# Patient Record
Sex: Male | Born: 1975 | Race: White | Hispanic: No | Smoking: Never smoker
Health system: Southern US, Community
[De-identification: ages and names within clinical notes are randomized; demographics above are authoritative.]

## PROBLEM LIST (undated history)

## (undated) DIAGNOSIS — C801 Malignant (primary) neoplasm, unspecified: Secondary | ICD-10-CM

## (undated) DIAGNOSIS — N289 Disorder of kidney and ureter, unspecified: Secondary | ICD-10-CM

## (undated) DIAGNOSIS — I10 Essential (primary) hypertension: Secondary | ICD-10-CM

## (undated) DIAGNOSIS — J40 Bronchitis, not specified as acute or chronic: Secondary | ICD-10-CM

## (undated) HISTORY — PX: TONSILLECTOMY: SUR1361

## (undated) HISTORY — PX: CYSTOSCOPY/RETROGRADE/URETEROSCOPY/STONE EXTRACTION WITH BASKET: SHX5317

---

## 2015-06-18 DIAGNOSIS — J40 Bronchitis, not specified as acute or chronic: Secondary | ICD-10-CM

## 2015-06-18 HISTORY — DX: Bronchitis, not specified as acute or chronic: J40

## 2016-02-19 ENCOUNTER — Observation Stay (HOSPITAL_COMMUNITY): Admission: EM | Admit: 2016-02-19 | Discharge: 2016-02-20 | Attending: Internal Medicine | Admitting: Internal Medicine

## 2016-02-19 ENCOUNTER — Encounter (HOSPITAL_COMMUNITY): Payer: Self-pay | Admitting: Emergency Medicine

## 2016-02-19 ENCOUNTER — Emergency Department (HOSPITAL_COMMUNITY)

## 2016-02-19 DIAGNOSIS — I10 Essential (primary) hypertension: Secondary | ICD-10-CM | POA: Diagnosis not present

## 2016-02-19 DIAGNOSIS — N200 Calculus of kidney: Secondary | ICD-10-CM | POA: Diagnosis not present

## 2016-02-19 DIAGNOSIS — R109 Unspecified abdominal pain: Secondary | ICD-10-CM | POA: Diagnosis present

## 2016-02-19 DIAGNOSIS — N201 Calculus of ureter: Secondary | ICD-10-CM

## 2016-02-19 DIAGNOSIS — N132 Hydronephrosis with renal and ureteral calculous obstruction: Principal | ICD-10-CM | POA: Insufficient documentation

## 2016-02-19 HISTORY — DX: Disorder of kidney and ureter, unspecified: N28.9

## 2016-02-19 HISTORY — DX: Malignant (primary) neoplasm, unspecified: C80.1

## 2016-02-19 LAB — URINALYSIS, ROUTINE W REFLEX MICROSCOPIC
BILIRUBIN URINE: NEGATIVE
GLUCOSE, UA: NEGATIVE mg/dL
KETONES UR: NEGATIVE mg/dL
LEUKOCYTES UA: NEGATIVE
Nitrite: NEGATIVE
PH: 5.5 (ref 5.0–8.0)
PROTEIN: NEGATIVE mg/dL
Specific Gravity, Urine: <1.005 — ABNORMAL LOW (ref 1.005–1.030)

## 2016-02-19 LAB — URINE MICROSCOPIC-ADD ON

## 2016-02-19 LAB — BASIC METABOLIC PANEL
ANION GAP: 9 (ref 5–15)
BUN: 14 mg/dL (ref 6–20)
CALCIUM: 9.5 mg/dL (ref 8.9–10.3)
CO2: 27 mmol/L (ref 22–32)
CREATININE: 1.19 mg/dL (ref 0.61–1.24)
Chloride: 104 mmol/L (ref 101–111)
GFR calc Af Amer: >60 mL/min (ref >60)
GLUCOSE: 88 mg/dL (ref 65–99)
Potassium: 3.8 mmol/L (ref 3.5–5.1)
Sodium: 140 mmol/L (ref 135–145)

## 2016-02-19 LAB — CBC WITH DIFFERENTIAL/PLATELET
BASOS ABS: 0 10*3/uL (ref 0.0–0.1)
Basophils Relative: 0 %
EOS PCT: 1 %
Eosinophils Absolute: 0.1 10*3/uL (ref 0.0–0.7)
HEMATOCRIT: 48.6 % (ref 39.0–52.0)
Hemoglobin: 16.6 g/dL (ref 13.0–17.0)
LYMPHS PCT: 25 %
Lymphs Abs: 1.9 10*3/uL (ref 0.7–4.0)
MCH: 30.1 pg (ref 26.0–34.0)
MCHC: 34.2 g/dL (ref 30.0–36.0)
MCV: 88 fL (ref 78.0–100.0)
MONO ABS: 0.5 10*3/uL (ref 0.1–1.0)
MONOS PCT: 6 %
NEUTROS ABS: 5.1 10*3/uL (ref 1.7–7.7)
Neutrophils Relative %: 68 %
PLATELETS: 173 10*3/uL (ref 150–400)
RBC: 5.52 MIL/uL (ref 4.22–5.81)
RDW: 12.2 % (ref 11.5–15.5)
WBC: 7.5 10*3/uL (ref 4.0–10.5)

## 2016-02-19 MED ORDER — SODIUM CHLORIDE 0.9 % IV BOLUS (SEPSIS)
1000.0000 mL | Freq: Once | INTRAVENOUS | Status: AC
  Administered 2016-02-19: 1000 mL via INTRAVENOUS

## 2016-02-19 MED ORDER — POLYETHYLENE GLYCOL 3350 17 G PO PACK: 17.0000 g | PACK | Freq: Every day | ORAL | Status: DC | PRN

## 2016-02-19 MED ORDER — HYDROMORPHONE HCL 1 MG/ML IJ SOLN
1.0000 mg | INTRAMUSCULAR | Status: AC | PRN
  Filled 2016-02-19: qty 1

## 2016-02-19 MED ORDER — HYDROMORPHONE HCL 1 MG/ML IJ SOLN
1.0000 mg | Freq: Once | INTRAMUSCULAR | Status: AC
  Administered 2016-02-19: 1 mg via INTRAVENOUS
  Filled 2016-02-19: qty 1

## 2016-02-19 MED ORDER — ONDANSETRON HCL 4 MG PO TABS: 4.0000 mg | ORAL_TABLET | Freq: Four times a day (QID) | ORAL | Status: DC | PRN

## 2016-02-19 MED ORDER — ONDANSETRON HCL 4 MG/2ML IJ SOLN
4.0000 mg | INTRAMUSCULAR | Status: DC | PRN
  Administered 2016-02-19: 4 mg via INTRAVENOUS

## 2016-02-19 MED ORDER — TAMSULOSIN HCL 0.4 MG PO CAPS
0.4000 mg | ORAL_CAPSULE | Freq: Two times a day (BID) | ORAL | Status: DC
  Administered 2016-02-19 – 2016-02-20 (×2): 0.4 mg via ORAL
  Filled 2016-02-19 (×2): qty 1

## 2016-02-19 MED ORDER — KETOROLAC TROMETHAMINE 30 MG/ML IJ SOLN
30.0000 mg | Freq: Once | INTRAMUSCULAR | Status: AC
  Administered 2016-02-19: 30 mg via INTRAVENOUS
  Filled 2016-02-19: qty 1

## 2016-02-19 MED ORDER — ONDANSETRON HCL 4 MG/2ML IJ SOLN: 4.0000 mg | Freq: Four times a day (QID) | INTRAMUSCULAR | Status: DC | PRN

## 2016-02-19 MED ORDER — TRAZODONE HCL 50 MG PO TABS
50.0000 mg | ORAL_TABLET | Freq: Every evening | ORAL | Status: DC | PRN
  Administered 2016-02-20: 50 mg via ORAL
  Filled 2016-02-19: qty 1

## 2016-02-19 MED ORDER — SODIUM CHLORIDE 0.9 % IV SOLN
INTRAVENOUS | Status: DC
  Administered 2016-02-20 (×2): via INTRAVENOUS

## 2016-02-19 MED ORDER — HYDROMORPHONE HCL 1 MG/ML IJ SOLN
1.0000 mg | INTRAMUSCULAR | Status: DC | PRN
  Administered 2016-02-20 (×2): 1 mg via INTRAVENOUS
  Filled 2016-02-19: qty 1

## 2016-02-19 MED ORDER — ONDANSETRON HCL 4 MG/2ML IJ SOLN
4.0000 mg | Freq: Three times a day (TID) | INTRAMUSCULAR | Status: DC | PRN
  Filled 2016-02-19: qty 2

## 2016-02-19 MED ORDER — ACETAMINOPHEN 650 MG RE SUPP: 650.0000 mg | Freq: Four times a day (QID) | RECTAL | Status: DC | PRN

## 2016-02-19 MED ORDER — ACETAMINOPHEN 325 MG PO TABS: 650.0000 mg | ORAL_TABLET | Freq: Four times a day (QID) | ORAL | Status: DC | PRN

## 2016-02-19 MED ORDER — SENNA 8.6 MG PO TABS
1.0000 | ORAL_TABLET | Freq: Two times a day (BID) | ORAL | Status: DC
  Administered 2016-02-20 (×2): 8.6 mg via ORAL
  Filled 2016-02-19 (×2): qty 1

## 2016-02-19 MED ORDER — ALBUTEROL SULFATE (2.5 MG/3ML) 0.083% IN NEBU: 2.5000 mg | INHALATION_SOLUTION | RESPIRATORY_TRACT | Status: DC | PRN

## 2016-02-19 MED ORDER — KETOROLAC TROMETHAMINE 30 MG/ML IJ SOLN
30.0000 mg | Freq: Four times a day (QID) | INTRAMUSCULAR | Status: DC
  Administered 2016-02-19 – 2016-02-20 (×4): 30 mg via INTRAVENOUS
  Filled 2016-02-19 (×4): qty 1

## 2016-02-19 MED ORDER — SODIUM CHLORIDE 0.9% FLUSH: 3.0000 mL | INTRAVENOUS | Status: DC | PRN

## 2016-02-19 MED ORDER — SODIUM CHLORIDE 0.9 % IV SOLN: 250.0000 mL | INTRAVENOUS | Status: DC | PRN

## 2016-02-19 MED ORDER — AMLODIPINE BESYLATE 5 MG PO TABS
5.0000 mg | ORAL_TABLET | Freq: Every day | ORAL | Status: DC
  Administered 2016-02-20: 5 mg via ORAL
  Filled 2016-02-19: qty 1

## 2016-02-19 MED ORDER — OXYCODONE-ACETAMINOPHEN 5-325 MG PO TABS
2.0000 | ORAL_TABLET | Freq: Once | ORAL | Status: AC
  Administered 2016-02-19: 2 via ORAL
  Filled 2016-02-19: qty 2

## 2016-02-19 MED ORDER — ONDANSETRON HCL 4 MG/2ML IJ SOLN
4.0000 mg | Freq: Once | INTRAMUSCULAR | Status: AC
  Administered 2016-02-19: 4 mg via INTRAVENOUS
  Filled 2016-02-19: qty 2

## 2016-02-19 MED ORDER — HYDROXYZINE HCL 50 MG/ML IM SOLN
25.0000 mg | Freq: Four times a day (QID) | INTRAMUSCULAR | Status: DC
  Administered 2016-02-20: 25 mg via INTRAMUSCULAR
  Filled 2016-02-19 (×5): qty 0.5

## 2016-02-19 MED ORDER — SODIUM CHLORIDE 0.9% FLUSH
3.0000 mL | Freq: Two times a day (BID) | INTRAVENOUS | Status: DC
  Administered 2016-02-20: 3 mL via INTRAVENOUS

## 2016-02-19 MED ORDER — HYDRALAZINE HCL 20 MG/ML IJ SOLN: 10.0000 mg | Freq: Four times a day (QID) | INTRAMUSCULAR | Status: DC | PRN

## 2016-02-19 MED ORDER — SODIUM CHLORIDE 0.9 % IV SOLN: INTRAVENOUS | Status: DC

## 2016-02-19 NOTE — H&P (Signed)
Patient Demographics:    Austin Hanson, is a 40 y.o. male  MRN: [id number]   DOB - 08/07/1975  Admit Date - 02/19/2016  Outpatient Primary MD for the patient is No PCP Per Patient   Assessment & Plan:    Principal Problem:   Nephrolithiasis Active Problems:   Kidney stone   Left nephrolithiasis  CT Abd Stone Protocol:- Mild to moderate left hydronephrosis due to a 0.8 x 0.5 x 1.0 cm upper to mid left ureteral stone. Three small nonobstructing stones right kidney.  1)Lt Sided nephrolithiasis with mild to moderate   Hydronephrosis-please see CT report above, give Flomax, scheduled IV Toradol, IV Dilaudid when necessary, Zofran and hydroxyzine as ordered. Dr. Junious Silk the Urologist to see in consultation, given that this stone is about 1 cm and higher up in the upper to mid left ureter, doubt patient would pass the stone spontaneously.  Patient had " basket" retrieval of kidney stone about 8 years ago. No fevers, no chills, no leukocytosis at this time.   2)HTN-stable, restart amlodipine,  may use IV Hydralazine 10 mg  Every 4 hours Prn for systolic blood pressure over 160 mmhg  3)Disposition- transfer from [facility] to [facility] at request of Dr. Junious Silk, the Urologist due to concerns about availability of  anesthesia services at [facility]. Post discharge patient will return back to  correctional facility  4)FEN- nothing by mouth for now, hydrate IV, monitor urine output and watch for possible passage of stones.   With History of - Reviewed by me  Past Medical History:  Diagnosis Date  . Renal disorder    kidney stones      Past Surgical History:  Procedure Laterality Date  . CYSTOSCOPY/RETROGRADE/URETEROSCOPY/STONE EXTRACTION WITH BASKET        Chief Complaint    Patient presents with  . Flank Pain      HPI:    Austin Hanson  is a 40 y.o. male, With history of hypertension on amlodipine and h/o Recurrent kidney stones since age 32 who presents with left flank pain associated with nausea but no vomiting. Left Flank pain started this morning, patient endorses some urinary frequency, No dysuria, no gross hematuria, No testicular symptoms, his current symptoms remind him of his previous kidney stone attacks.   no fever no chills. No diarrhea. Correctional officer at bedside at time of my evaluation.   In the ED CT abdomen with stone protocol reveals 0.81 cm left upper to mid ureteral stone with mild-to-moderate hydronephrosis.     Review of systems:    In addition to the HPI above,   A full 12 point Review of 10 Systems was done, except as stated above, all other Review of 10 Systems were negative.    Social History:  Reviewed by me    Social History  Substance Use Topics  . Smoking status: Never Smoker  . Smokeless tobacco: Never Used  .  Alcohol use No       Family History :  Reviewed by me   History reviewed. No pertinent family history.    Home Medications:   Prior to Admission medications   Medication Sig Start Date End Date Taking? Authorizing Provider  AmLODIPine Besylate (NORVASC PO) Take 5 tablets by mouth daily.    Yes Historical Provider, MD     Allergies:     Allergies  Allergen Reactions  . Morphine And Related Hives     Physical Exam:   Vitals  Blood pressure 139/87, pulse (!) 57, temperature 97.8 F (36.6 C), temperature source Oral, resp. rate 18, height 5\' 10"  (1.778 m), weight 90.7 kg (200 lb), SpO2 98 %.  Physical Examination: General appearance - alert, well appearing, and in no distress  Mental status - alert, oriented to person, place, and time,  Eyes - sclera anicteric Neck - supple, no JVD elevation , Chest - clear  to auscultation bilaterally, symmetrical air movement,  Heart - S1 and  S2 normal,  Abdomen - soft, left lower tenderness with positive CVA area tenderness, nondistended, no masses or organomegaly Neurological - screening mental status exam normal, neck supple without rigidity, cranial nerves II through XII intact, DTR's normal and symmetric Extremities - no pedal edema noted, intact peripheral pulses Skin - warm, dry    Data Review:    CBC  Recent Labs Lab 02/19/16 1635  WBC 7.5  HGB 16.6  HCT 48.6  PLT 173  MCV 88.0  MCH 30.1  MCHC 34.2  RDW 12.2  LYMPHSABS 1.9  MONOABS 0.5  EOSABS 0.1  BASOSABS 0.0   ------------------------------------------------------------------------------------------------------------------  Chemistries   Recent Labs Lab 02/19/16 1635  NA 140  K 3.8  CL 104  CO2 27  GLUCOSE 88  BUN 14  CREATININE 1.19  CALCIUM 9.5   ------------------------------------------------------------------------------------------------------------------ estimated creatinine clearance is 91.6 mL/min (by C-G formula based on SCr of 1.19 mg/dL). ------------------------------------------------------------------------------------------------------------------ No results for input(s): TSH, T4TOTAL, T3FREE, THYROIDAB in the last 72 hours.  Invalid input(s): FREET3   Coagulation profile No results for input(s): INR, PROTIME in the last 168 hours. ------------------------------------------------------------------------------------------------------------------- No results for input(s): DDIMER in the last 72 hours. -------------------------------------------------------------------------------------------------------------------  Cardiac Enzymes No results for input(s): CKMB, TROPONINI, MYOGLOBIN in the last 168 hours.  Invalid input(s): CK ------------------------------------------------------------------------------------------------------------------ No results found for:  BNP   ---------------------------------------------------------------------------------------------------------------  Urinalysis    Component Value Date/Time   COLORURINE YELLOW 02/19/2016 [place] 02/19/2016 1635   LABSPEC <1.005 (L) 02/19/2016 1635   PHURINE 5.5 02/19/2016 1635   GLUCOSEU NEGATIVE 02/19/2016 1635   HGBUR MODERATE (A) 02/19/2016 1635   BILIRUBINUR NEGATIVE 02/19/2016 1635   KETONESUR NEGATIVE 02/19/2016 1635   PROTEINUR NEGATIVE 02/19/2016 1635   NITRITE NEGATIVE 02/19/2016 1635   LEUKOCYTESUR NEGATIVE 02/19/2016 1635    ----------------------------------------------------------------------------------------------------------------   Imaging Results:    Ct Renal Stone Study  Result Date: 02/19/2016 CLINICAL DATA:  Acute onset left flank pain at 8 a.m. this morning. History of kidney stones. EXAM: CT ABDOMEN AND PELVIS WITHOUT CONTRAST TECHNIQUE: Multidetector CT imaging of the abdomen and pelvis was performed following the standard protocol without IV contrast. COMPARISON:  None. FINDINGS: Mild dependent atelectasis is seen in the left lung base. Lung bases are otherwise clear. No pleural or pericardial effusion. Heart size is normal. There is mild left hydronephrosis with stranding about the left kidney and ureter due to a mid to upper left ureteral stone at the level of the L3  measuring 0.8 cm AP by 0.5 cm transverse by 1 cm craniocaudal. No other urinary tract stones are seen on the left. The patient has 3 small nonobstructing right renal stones. No hydronephrosis on the right or ureteral stone. No urinary bladder stones. Seminal vesicles and prostate gland are unremarkable. The gallbladder, liver, spleen, adrenal glands and pancreas appear normal. Small fat containing umbilical hernia is noted. The stomach and small and large bowel appear normal. No evidence of appendicitis is identified. No lymphadenopathy or fluid. No lytic or sclerotic bony lesion.  IMPRESSION: Mild to moderate left hydronephrosis due to a 0.8 x 0.5 x 1.0 cm upper to mid left ureteral stone. Three small nonobstructing stones right kidney. Small fat containing umbilical hernia. Electronically Signed   By: Inge Rise M.D.   On: 02/19/2016 17:39    Radiological Exams on Admission: Ct Renal Stone Study  Result Date: 02/19/2016 CLINICAL DATA:  Acute onset left flank pain at 8 a.m. this morning. History of kidney stones. EXAM: CT ABDOMEN AND PELVIS WITHOUT CONTRAST TECHNIQUE: Multidetector CT imaging of the abdomen and pelvis was performed following the standard protocol without IV contrast. COMPARISON:  None. FINDINGS: Mild dependent atelectasis is seen in the left lung base. Lung bases are otherwise clear. No pleural or pericardial effusion. Heart size is normal. There is mild left hydronephrosis with stranding about the left kidney and ureter due to a mid to upper left ureteral stone at the level of the L3 measuring 0.8 cm AP by 0.5 cm transverse by 1 cm craniocaudal. No other urinary tract stones are seen on the left. The patient has 3 small nonobstructing right renal stones. No hydronephrosis on the right or ureteral stone. No urinary bladder stones. Seminal vesicles and prostate gland are unremarkable. The gallbladder, liver, spleen, adrenal glands and pancreas appear normal. Small fat containing umbilical hernia is noted. The stomach and small and large bowel appear normal. No evidence of appendicitis is identified. No lymphadenopathy or fluid. No lytic or sclerotic bony lesion. IMPRESSION: Mild to moderate left hydronephrosis due to a 0.8 x 0.5 x 1.0 cm upper to mid left ureteral stone. Three small nonobstructing stones right kidney. Small fat containing umbilical hernia. Electronically Signed   By: Inge Rise M.D.   On: 02/19/2016 17:39    DVT Prophylaxis -SCD  AM Labs Ordered, also please review Full Orders  Family Communication: Admission, patients condition and plan  of care including tests being ordered have been discussed with the patient who indicate understanding and agree with the plan   Code Status - Full Code  Likely DC to  correctional facility  Condition   stable  Austin Hanson M.D on 02/19/2016 at 9:35 PM   Between 7am to 7pm - Pager - [phone]  After 7pm go to [email or web address] - password [email or web address]  [facility] - Office  [phone]  Dragon dictation system was used to create this note, attempts have been made to correct errors, however presence of uncorrected errors is not a reflection quality of care provided.

## 2016-02-19 NOTE — ED Provider Notes (Signed)
[place] DEPT Provider Note   CSN: [id number] Arrival date & time: 02/19/16  1611     History   Chief Complaint Chief Complaint  Patient presents with  . Flank Pain    HPI Austin Hanson is a 40 y.o. male.  Patient presents with left-sided flank pain onset this morning upon waking. It comes and goes and feels similar to previous kidney stones. It radiates to his left lower abdomen. Endorses urinary frequency and urgency but denies hematuria. Reports previous surgery for kidney stone several years ago. Denies fever. Has nausea but no vomiting. Denies chest pain or shortness of breath. Denies any testicular pain. Is not taking anything for the pain. He is incarcerated in prison. No change in bowel habits.   The history is provided by the patient.  Flank Pain  Associated symptoms include abdominal pain. Pertinent negatives include no chest pain, no headaches and no shortness of breath.    Past Medical History:  Diagnosis Date  . Renal disorder    kidney stones    There are no active problems to display for this patient.   Past Surgical History:  Procedure Laterality Date  . CYSTOSCOPY/RETROGRADE/URETEROSCOPY/STONE EXTRACTION WITH BASKET         Home Medications    Prior to Admission medications   Medication Sig Start Date End Date Taking? Authorizing Provider  AmLODIPine Besylate (NORVASC PO) Take 5 tablets by mouth daily.    Yes Historical Provider, MD    Family History History reviewed. No pertinent family history.  Social History Social History  Substance Use Topics  . Smoking status: Never Smoker  . Smokeless tobacco: Never Used  . Alcohol use No     Allergies   Morphine and related   Review of Systems Review of Systems  Constitutional: Negative for activity change, appetite change, fatigue and fever.  HENT: Negative for congestion and rhinorrhea.   Eyes: Negative for visual disturbance.  Respiratory: Negative for cough, chest tightness  and shortness of breath.   Cardiovascular: Negative for chest pain.  Gastrointestinal: Positive for abdominal pain and nausea. Negative for vomiting.  Genitourinary: Positive for dysuria and flank pain. Negative for hematuria, scrotal swelling and testicular pain.  Musculoskeletal: Positive for back pain. Negative for arthralgias and myalgias.  Skin: Negative for wound.  Neurological: Negative for dizziness, weakness and headaches.   A complete 10 system review of systems was obtained and all systems are negative except as noted in the HPI and PMH.    Physical Exam Updated Vital Signs BP (!) 141/104 (BP Location: Left Arm)   Pulse (!) 127   Temp 97.8 F (36.6 C) (Oral)   Resp 16   Ht 5\' 10"  (1.778 m)   Wt 200 lb (90.7 kg)   SpO2 100%   BMI 28.70 kg/m   Physical Exam  Constitutional: He is oriented to person, place, and time. He appears well-developed and well-nourished. No distress.  HENT:  Head: Normocephalic and atraumatic.  Mouth/Throat: Oropharynx is clear and moist. No oropharyngeal exudate.  Eyes: Conjunctivae and EOM are normal. Pupils are equal, round, and reactive to light.  Neck: Normal range of motion. Neck supple.  No meningismus.  Cardiovascular: Normal rate, regular rhythm, normal heart sounds and intact distal pulses.   No murmur heard. Pulmonary/Chest: Effort normal and breath sounds normal. No respiratory distress.  Abdominal: Soft. There is tenderness. There is no rebound and no guarding.  L sided abdominal pain, no guarding or rebound  Genitourinary:  Genitourinary Comments: No testicular  tenderness  Musculoskeletal: Normal range of motion. He exhibits tenderness. He exhibits no edema.  L CVAT  Neurological: He is alert and oriented to person, place, and time. No cranial nerve deficit. He exhibits normal muscle tone. Coordination normal.  No ataxia on finger to nose bilaterally. No pronator drift. 5/5 strength throughout. CN 2-12 intact.Equal grip  strength. Sensation intact.   Skin: Skin is warm.  Psychiatric: He has a normal mood and affect. His behavior is normal.  Nursing note and vitals reviewed.    ED Treatments / Results  Labs (all labs ordered are listed, but only abnormal results are displayed) Labs Reviewed  URINALYSIS, ROUTINE W REFLEX MICROSCOPIC (NOT AT [facility]) - Abnormal; Notable for the following:       Result Value   Specific Gravity, Urine <1.005 (*)    Hgb urine dipstick MODERATE (*)    All other components within normal limits  URINE MICROSCOPIC-ADD ON - Abnormal; Notable for the following:    Squamous Epithelial / LPF 0-5 (*)    Bacteria, UA RARE (*)    All other components within normal limits  CBC WITH DIFFERENTIAL/PLATELET  BASIC METABOLIC PANEL    EKG  EKG Interpretation None       Radiology Ct Renal Stone Study  Result Date: 02/19/2016 CLINICAL DATA:  Acute onset left flank pain at 8 a.m. this morning. History of kidney stones. EXAM: CT ABDOMEN AND PELVIS WITHOUT CONTRAST TECHNIQUE: Multidetector CT imaging of the abdomen and pelvis was performed following the standard protocol without IV contrast. COMPARISON:  None. FINDINGS: Mild dependent atelectasis is seen in the left lung base. Lung bases are otherwise clear. No pleural or pericardial effusion. Heart size is normal. There is mild left hydronephrosis with stranding about the left kidney and ureter due to a mid to upper left ureteral stone at the level of the L3 measuring 0.8 cm AP by 0.5 cm transverse by 1 cm craniocaudal. No other urinary tract stones are seen on the left. The patient has 3 small nonobstructing right renal stones. No hydronephrosis on the right or ureteral stone. No urinary bladder stones. Seminal vesicles and prostate gland are unremarkable. The gallbladder, liver, spleen, adrenal glands and pancreas appear normal. Small fat containing umbilical hernia is noted. The stomach and small and large bowel appear normal. No evidence of  appendicitis is identified. No lymphadenopathy or fluid. No lytic or sclerotic bony lesion. IMPRESSION: Mild to moderate left hydronephrosis due to a 0.8 x 0.5 x 1.0 cm upper to mid left ureteral stone. Three small nonobstructing stones right kidney. Small fat containing umbilical hernia. Electronically Signed   By: Inge Rise M.D.   On: 02/19/2016 17:39    Procedures Procedures (including critical care time)  Medications Ordered in ED Medications  sodium chloride 0.9 % bolus 1,000 mL (1,000 mLs Intravenous New Bag/Given 02/19/16 1723)  HYDROmorphone (DILAUDID) injection 1 mg (1 mg Intravenous Given 02/19/16 1723)  ondansetron (ZOFRAN) injection 4 mg (4 mg Intravenous Given 02/19/16 1723)  ketorolac (TORADOL) 30 MG/ML injection 30 mg (30 mg Intravenous Given 02/19/16 1723)  HYDROmorphone (DILAUDID) injection 1 mg (1 mg Intravenous Given 02/19/16 1834)     Initial Impression / Assessment and Plan / ED Course  I have reviewed the triage vital signs and the nursing notes.  Pertinent labs & imaging results that were available during my care of the patient were reviewed by me and considered in my medical decision making (see chart for details).  Clinical Course  Flank pain  similar previous kidney stones. Associated with nausea but no vomiting. No fever.  CT confirms large kidney stone, proximal L ureter. There is no evidence of infection on urinalysis.  Patient with difficult control pain despite multiple doses of IV Dilaudid and IV Toradol.  Discussed with Dr. Junious Silk of urology. He requests  hospitalist admission for pain control. Advises admission to [facility] as there is no anesthesiologist at [facility].  D/w Dr. Denton Brick who will arrange transfer to [facility]. Patient stable at time of transfer.  Final Clinical Impressions(s) / ED Diagnoses   Final diagnoses:  Flank pain  Ureterolithiasis    New Prescriptions New Prescriptions   No medications on file     Ezequiel Essex,  MD 02/20/16 0117

## 2016-02-19 NOTE — ED Triage Notes (Signed)
Reports sudden onset left flank pain at 0800 this morning.  Hx of kidney stones.

## 2016-02-19 NOTE — ED Notes (Signed)
States pain is returning, MD notified.

## 2016-02-20 ENCOUNTER — Observation Stay (HOSPITAL_COMMUNITY): Admitting: Certified Registered Nurse Anesthetist

## 2016-02-20 ENCOUNTER — Encounter (HOSPITAL_COMMUNITY): Payer: Self-pay

## 2016-02-20 ENCOUNTER — Encounter (HOSPITAL_COMMUNITY): Admission: EM | Payer: Self-pay | Source: Home / Self Care | Attending: Emergency Medicine

## 2016-02-20 ENCOUNTER — Observation Stay (HOSPITAL_COMMUNITY)

## 2016-02-20 DIAGNOSIS — N200 Calculus of kidney: Secondary | ICD-10-CM

## 2016-02-20 DIAGNOSIS — N132 Hydronephrosis with renal and ureteral calculous obstruction: Secondary | ICD-10-CM | POA: Diagnosis not present

## 2016-02-20 DIAGNOSIS — I10 Essential (primary) hypertension: Secondary | ICD-10-CM | POA: Diagnosis not present

## 2016-02-20 HISTORY — PX: CYSTOSCOPY W/ URETERAL STENT PLACEMENT: SHX1429

## 2016-02-20 LAB — CBC
HEMATOCRIT: 40.6 % (ref 39.0–52.0)
HEMOGLOBIN: 13.7 g/dL (ref 13.0–17.0)
MCH: 30 pg (ref 26.0–34.0)
MCHC: 33.7 g/dL (ref 30.0–36.0)
MCV: 88.8 fL (ref 78.0–100.0)
Platelets: 161 10*3/uL (ref 150–400)
RBC: 4.57 MIL/uL (ref 4.22–5.81)
RDW: 12.5 % (ref 11.5–15.5)
WBC: 5.8 10*3/uL (ref 4.0–10.5)

## 2016-02-20 LAB — MRSA PCR SCREENING: MRSA by PCR: NEGATIVE

## 2016-02-20 LAB — BASIC METABOLIC PANEL
Anion gap: 5 (ref 5–15)
BUN: 10 mg/dL (ref 6–20)
CHLORIDE: 106 mmol/L (ref 101–111)
CO2: 28 mmol/L (ref 22–32)
Calcium: 8.4 mg/dL — ABNORMAL LOW (ref 8.9–10.3)
Creatinine, Ser: 1.01 mg/dL (ref 0.61–1.24)
GFR calc non Af Amer: >60 mL/min (ref >60)
Glucose, Bld: 120 mg/dL — ABNORMAL HIGH (ref 65–99)
POTASSIUM: 3.3 mmol/L — AB (ref 3.5–5.1)
SODIUM: 139 mmol/L (ref 135–145)

## 2016-02-20 SURGERY — CYSTOSCOPY, WITH RETROGRADE PYELOGRAM AND URETERAL STENT INSERTION
Anesthesia: General | Laterality: Left

## 2016-02-20 MED ORDER — LACTATED RINGERS IV SOLN
INTRAVENOUS | Status: DC
Start: 2016-02-20 — End: 2016-02-20

## 2016-02-20 MED ORDER — MIDAZOLAM HCL 2 MG/2ML IJ SOLN
INTRAMUSCULAR | Status: AC
  Filled 2016-02-20: qty 2

## 2016-02-20 MED ORDER — TAMSULOSIN HCL 0.4 MG PO CAPS: 0.4000 mg | ORAL_CAPSULE | Freq: Two times a day (BID) | ORAL | 0 refills | Status: AC

## 2016-02-20 MED ORDER — POTASSIUM CHLORIDE CRYS ER 20 MEQ PO TBCR
40.0000 meq | EXTENDED_RELEASE_TABLET | Freq: Once | ORAL | Status: AC
  Administered 2016-02-20: 40 meq via ORAL
  Filled 2016-02-20: qty 2

## 2016-02-20 MED ORDER — LIDOCAINE 2% (20 MG/ML) 5 ML SYRINGE
INTRAMUSCULAR | Status: AC
  Filled 2016-02-20: qty 5

## 2016-02-20 MED ORDER — ONDANSETRON HCL 4 MG/2ML IJ SOLN
INTRAMUSCULAR | Status: AC
  Filled 2016-02-20: qty 2

## 2016-02-20 MED ORDER — EPHEDRINE 5 MG/ML INJ
INTRAVENOUS | Status: AC
  Filled 2016-02-20: qty 10

## 2016-02-20 MED ORDER — LACTATED RINGERS IV SOLN
INTRAVENOUS | Status: DC | PRN
  Administered 2016-02-20: 03:00:00 via INTRAVENOUS

## 2016-02-20 MED ORDER — IBUPROFEN 200 MG PO TABS: 200.0000 mg | ORAL_TABLET | Freq: Four times a day (QID) | ORAL | 0 refills | Status: AC | PRN

## 2016-02-20 MED ORDER — MIDAZOLAM HCL 5 MG/5ML IJ SOLN
INTRAMUSCULAR | Status: DC | PRN
  Administered 2016-02-20: 2 mg via INTRAVENOUS

## 2016-02-20 MED ORDER — CEFAZOLIN IN D5W 1 GM/50ML IV SOLN
1.0000 g | Freq: Three times a day (TID) | INTRAVENOUS | Status: DC
  Administered 2016-02-20: 1 g via INTRAVENOUS
  Filled 2016-02-20 (×2): qty 50

## 2016-02-20 MED ORDER — PROPOFOL 10 MG/ML IV BOLUS
INTRAVENOUS | Status: DC | PRN
  Administered 2016-02-20: 200 mg via INTRAVENOUS

## 2016-02-20 MED ORDER — CEFAZOLIN SODIUM-DEXTROSE 2-4 GM/100ML-% IV SOLN
2.0000 g | INTRAVENOUS | Status: AC
  Administered 2016-02-20: 2 g via INTRAVENOUS
  Filled 2016-02-20: qty 100

## 2016-02-20 MED ORDER — FENTANYL CITRATE (PF) 100 MCG/2ML IJ SOLN: 25.0000 ug | INTRAMUSCULAR | Status: DC | PRN

## 2016-02-20 MED ORDER — LIDOCAINE HCL (CARDIAC) 20 MG/ML IV SOLN
INTRAVENOUS | Status: DC | PRN
  Administered 2016-02-20: 100 mg via INTRAVENOUS

## 2016-02-20 MED ORDER — PROPOFOL 10 MG/ML IV BOLUS
INTRAVENOUS | Status: AC
  Filled 2016-02-20: qty 20

## 2016-02-20 MED ORDER — FENTANYL CITRATE (PF) 100 MCG/2ML IJ SOLN
INTRAMUSCULAR | Status: DC | PRN
  Administered 2016-02-20: 25 ug via INTRAVENOUS

## 2016-02-20 MED ORDER — AMLODIPINE BESYLATE 5 MG PO TABS: 5.0000 mg | ORAL_TABLET | Freq: Every day | ORAL | 0 refills | Status: AC

## 2016-02-20 MED ORDER — ONDANSETRON HCL 4 MG/2ML IJ SOLN
INTRAMUSCULAR | Status: DC | PRN
  Administered 2016-02-20: 4 mg via INTRAVENOUS

## 2016-02-20 MED ORDER — FENTANYL CITRATE (PF) 100 MCG/2ML IJ SOLN
INTRAMUSCULAR | Status: AC
  Filled 2016-02-20: qty 2

## 2016-02-20 MED ORDER — SODIUM CHLORIDE 0.9 % IR SOLN
Status: DC | PRN
  Administered 2016-02-20: 3000 mL

## 2016-02-20 MED ORDER — IOHEXOL 300 MG/ML  SOLN
INTRAMUSCULAR | Status: DC | PRN
  Administered 2016-02-20: 10 mL

## 2016-02-20 MED ORDER — TRAMADOL HCL 50 MG PO TABS
50.0000 mg | ORAL_TABLET | Freq: Two times a day (BID) | ORAL | Status: DC | PRN
  Administered 2016-02-20: 50 mg via ORAL
  Filled 2016-02-20: qty 1

## 2016-02-20 SURGICAL SUPPLY — 13 items
BAG URO CATCHER STRL LF (MISCELLANEOUS) ×2 IMPLANT
BASKET ZERO TIP NITINOL 2.4FR (BASKET) IMPLANT
CATH INTERMIT  6FR 70CM (CATHETERS) ×2 IMPLANT
CLOTH BEACON ORANGE TIMEOUT ST (SAFETY) ×2 IMPLANT
GLOVE BIOGEL M STRL SZ7.5 (GLOVE) ×2 IMPLANT
GOWN STRL REUS W/TWL LRG LVL3 (GOWN DISPOSABLE) ×2 IMPLANT
GOWN STRL REUS W/TWL XL LVL3 (GOWN DISPOSABLE) ×2 IMPLANT
GUIDEWIRE ANG ZIPWIRE 038X150 (WIRE) IMPLANT
GUIDEWIRE STR DUAL SENSOR (WIRE) ×2 IMPLANT
MANIFOLD NEPTUNE II (INSTRUMENTS) ×2 IMPLANT
PACK CYSTO (CUSTOM PROCEDURE TRAY) ×2 IMPLANT
STENT CONTOUR 6FRX26X.038 (STENTS) ×2 IMPLANT
TUBING CONNECTING 10 (TUBING) ×2 IMPLANT

## 2016-02-20 NOTE — Anesthesia Procedure Notes (Signed)
Procedure Name: LMA Insertion Date/Time: 02/20/2016 3:09 AM Performed by: Maxwell Caul Pre-anesthesia Checklist: Patient identified, Emergency Drugs available, Suction available and Patient being monitored Patient Re-evaluated:Patient Re-evaluated prior to inductionOxygen Delivery Method: Circle system utilized Preoxygenation: Pre-oxygenation with 100% oxygen Intubation Type: IV induction LMA: LMA inserted and LMA with gastric port inserted LMA Size: 5.0 Number of attempts: 1 Placement Confirmation: positive ETCO2 and breath sounds checked- equal and bilateral Tube secured with: Tape Dental Injury: Teeth and Oropharynx as per pre-operative assessment

## 2016-02-20 NOTE — Discharge Instructions (Signed)
Ureteral Stent Implantation, Care After Refer to this sheet in the next few weeks. These instructions provide you with information on caring for yourself after your procedure. Your health care provider may also give you more specific instructions. Your treatment has been planned according to current medical practices, but problems sometimes occur. Call your health care provider if you have any problems or questions after your procedure. WHAT TO EXPECT AFTER THE PROCEDURE You should be back to normal activity within 48 hours after the procedure. Nausea and vomiting may occur and are commonly the result of anesthesia. It is common to experience sharp pain in the back or lower abdomen and penis with voiding. This is caused by movement of the ends of the stent with the act of urinating.It usually goes away within minutes after you have stopped urinating. HOME CARE INSTRUCTIONS Make sure to drink plenty of fluids. You may have small amounts of bleeding, causing your urine to be red. This is normal. Certain movements may trigger pain or a feeling that you need to urinate. You may be given medicines to prevent infection or bladder spasms. Be sure to take all medicines as directed. Only take over-the-counter or prescription medicines for pain, discomfort, or fever as directed by your health care provider. Do not take aspirin, as this can make bleeding worse.  Your stent will be left in until the blockage is resolved/the stone is treated. Be sure to keep all follow-up appointments so your health care provider can check that you are healing properly and to plan stent removal.   SEEK MEDICAL CARE IF:  You experience increasing pain.  Your pain medicine is not working. SEEK IMMEDIATE MEDICAL CARE IF:  Your urine is dark red or has blood clots.  You are leaking urine (incontinent).  You have a fever, chills, feeling sick to your stomach (nausea), or vomiting.  Your pain is not relieved by pain  medicine.  The end of the stent comes out of the urethra.  You are unable to urinate.   This information is not intended to replace advice given to you by your health care provider. Make sure you discuss any questions you have with your health care provider.   Document Released: 02/03/2013 Document Revised: 06/08/2013 Document Reviewed: 12/16/2014 Elsevier Interactive Patient Education 2016 [place].   Follow with Primary MD  Get CBC, CMP,checked  by Primary MD next visit.    Activity: As tolerated with Full fall precautions use walker/cane & assistance as needed   Disposition Home   Diet: Regular diet   On your next visit with your primary care physician please Get Medicines reviewed and adjusted.   Please request your Prim.MD to go over all Hospital Tests and Procedure/Radiological results at the follow up, please get all Hospital records sent to your Prim MD by signing hospital release before you go home.   If you experience worsening of your admission symptoms, develop shortness of breath, life threatening emergency, suicidal or homicidal thoughts you must seek medical attention immediately by calling 911 or calling your MD immediately  if symptoms less severe.  You Must read complete instructions/literature along with all the possible adverse reactions/side effects for all the Medicines you take and that have been prescribed to you. Take any new Medicines after you have completely understood and accpet all the possible adverse reactions/side effects.   Do not drive, operating heavy machinery, perform activities at heights, swimming or participation in water activities or provide baby sitting services if your were  admitted for syncope or siezures until you have seen by Primary MD or a Neurologist and advised to do so again.  Do not drive when taking Pain medications.    Do not take more than prescribed Pain, Sleep and Anxiety Medications  Special Instructions: If  you have smoked or chewed Tobacco  in the last 2 yrs please stop smoking, stop any regular Alcohol  and or any Recreational drug use.  Wear Seat belts while driving.   Please note  You were cared for by a hospitalist during your hospital stay. If you have any questions about your discharge medications or the care you received while you were in the hospital after you are discharged, you can call the unit and asked to speak with the hospitalist on call if the hospitalist that took care of you is not available. Once you are die that you return to your primary care physician (or establish a relationship with a primary care physician if you do not have one) for your aftercare needs so that they can reassess your need for medications and monitor your lab values.

## 2016-02-20 NOTE — Transfer of Care (Signed)
Immediate Anesthesia Transfer of Care Note  Patient: Austin Hanson  Procedure(s) Performed: Procedure(s): CYSTOSCOPY WITH RETROGRADE PYELOGRAM/URETERAL STENT PLACEMENT (Left)  Patient Location: PACU  Anesthesia Type:General  Level of Consciousness:  sedated, patient cooperative and responds to stimulation  Airway & Oxygen Therapy:Patient Spontanous Breathing and Patient connected to face mask oxgen  Post-op Assessment:  Report given to PACU RN and Post -op Vital signs reviewed and stable  Post vital signs:  Reviewed and stable  Last Vitals:  Vitals:   02/19/16 2325 02/20/16 0353  BP: (!) 154/97 (!) 154/94  Pulse: (!) 50   Resp: 16 14  Temp: 36.6 C [id number] C    Complications: No apparent anesthesia complications

## 2016-02-20 NOTE — Care Management Note (Signed)
Case Management Note  Patient Details  Name: Austin Hanson MRN: [id number] Date of Birth: 12-07-1975  Subjective/Objective: 40 y/o m admitted w/L hydronephrosis. S/p L ureter stent. From [employer] in [place].                   Action/Plan:d/c back to prison.   Expected Discharge Date:                  Expected Discharge Plan:  [facility]rections Facility  In-House Referral:     Discharge planning Services  CM Consult  Post Acute Care Choice:    Choice offered to:     DME Arranged:    DME Agency:     HH Arranged:    [place] Agency:     Status of Service:  In process, will continue to follow  If discussed at Long Length of Stay Meetings, dates discussed:    Additional Comments:  Dessa Phi, RN 02/20/2016, 10:48 AM

## 2016-02-20 NOTE — Op Note (Signed)
Preoperative diagnosis: Left proximal ureteral stone Postoperative diagnosis: Left proximal ureteral stone  Procedure: Left ureteral stent, cystoscopy, left retrograde pyelogram  Surgeon: Junious Silk  Anesthesia: Gen.  Indication for procedure: 40 year old with symptomatic left proximal ureteral stone. I should add he did ask about dissolution therapy prior to the procedure. However I discussed with the density of the stone and the visibility on the scout images likely contained mostly calcium and dissolution therapy would not be effective.  Findings: Left retrograde pyelogram-this outlined a single ureter single collecting system unit with a proximal filling defect consistent with the stone.  Description of procedure: After consent was obtained the patient was brought to the operating room. After adequate anesthesia he was placed in lithotomy position and prepped and draped in the usual sterile fashion. A timeout was performed to confirm the patient and procedure. The penis was circumcised and without mass or lesion. The cystoscope was passed per urethra into the bladder. The bladder was unremarkable. There were no stones or foreign bodies in the bladder. The left ureteral orifice was cannulated with a 6 [place] open-ended catheter and retrograde injection of contrast was performed. A sensor wire was then advanced but would not pass the stone. I had to pass the 6 French catheter up to the stone to brace the wire and the wire did pass. The catheter was advanced to the collecting system and the wire removed. A second retrograde injection was performed which confirm proper placement in the lumen of the collecting system. The wire was replaced and the 6 [place] open-ended removed. A 6 x 26 cm stent was advanced and the wire removed. A good coil was seen in the bladder with the proximal coil in the upper pole collecting system. The bladder was drained and the scope removed. The patient was awakened and taken to  the recovery room in stable condition.   Complications: None  Estimated blood loss: Minimal  Specimens: None  Drains: 6 x 26 cm left ureteral stent

## 2016-02-20 NOTE — Anesthesia Preprocedure Evaluation (Signed)
Anesthesia Evaluation  Patient identified by MRN, date of birth, ID band Patient awake    Reviewed: Allergy & Precautions, H&P , Patient's Chart, lab work & pertinent test results, reviewed documented beta blocker date and time   Airway Mallampati: II  TM Distance: >3 FB Neck ROM: full    Dental no notable dental hx.    Pulmonary    Pulmonary exam normal breath sounds clear to auscultation       Cardiovascular hypertension, On Medications  Rhythm:regular Rate:Normal     Neuro/Psych    GI/Hepatic   Endo/Other    Renal/GU      Musculoskeletal   Abdominal   Peds  Hematology   Anesthesia Other Findings   Reproductive/Obstetrics                             Anesthesia Physical Anesthesia Plan  ASA: II and emergent  Anesthesia Plan:    Post-op Pain Management:    Induction: Intravenous  Airway Management Planned: LMA  Additional Equipment:   Intra-op Plan:   Post-operative Plan:   Informed Consent: I have reviewed the patients History and Physical, chart, labs and discussed the procedure including the risks, benefits and alternatives for the proposed anesthesia with the patient or authorized representative who has indicated his/her understanding and acceptance.   Dental Advisory Given and Dental advisory given  Plan Discussed with: CRNA and Surgeon  Anesthesia Plan Comments: (Discussed GA with LMA, possible sore throat, potential need to switch to ETT, N/V, pulmonary aspiration. Questions answered. )        Anesthesia Quick Evaluation

## 2016-02-20 NOTE — Progress Notes (Addendum)
   Pt stable. Has some stent pain.   Vitals:   02/20/16 0430 02/20/16 0442  BP: (!) 139/96 128/84  Pulse: 62 68  Resp:  16  Temp: 97.2 F (36.2 C) 97.6 F (36.4 C)     NAD A&Ox3  KUB - stone visible in left proximal ureter adjacent to stent.  A/P: -left proximal stone s/p left stent - discussed with patient nature, r/b of URS vs ESWL (and relative success rates). All questions answered. Will plan left ESWL and f/u AP clinic with Dr. Alyson Ingles for cysto/stent removal. Discussed with patient importance of f/u as stents are temporary and failure to f/u can lead to permanent kidney damage.   Appreciate excellent hospitalist care.

## 2016-02-20 NOTE — Consult Note (Signed)
Consult: left ureteral stone  History of Present Illness: 40 yo male who developed left flank pain today. CT revealed 8 x 10 mm left proximal stone. There were no other left side stones, a few small right. The stone was visible on scout, HU 1016 and 11 cm SSD. Patient continues to have significant pain requiring repeat Toradol and hydromorphone.  Patient reports prior kidney stone requiring "basket removal" about 10 years ago in [place], [place].   Past Medical History:  Diagnosis Date  . Cancer ([place])    skin cancer  . Renal disorder    kidney stones   Past Surgical History:  Procedure Laterality Date  . CYSTOSCOPY/RETROGRADE/URETEROSCOPY/STONE EXTRACTION WITH BASKET      Home Medications:  Prescriptions Prior to Admission  Medication Sig Dispense Refill Last Dose  . AmLODIPine Besylate (NORVASC PO) Take 5 tablets by mouth daily.    02/19/2016 at Unknown time   Allergies:  Allergies  Allergen Reactions  . Morphine And Related Hives    Family History  Problem Relation Age of Onset  . Hypertension Mother   . Hypertension Father    Social History:  reports that he has never smoked. He has never used smokeless tobacco. He reports that he does not drink alcohol or use drugs.  ROS: A complete review of systems was performed.  All systems are negative except for pertinent findings as noted. ROS   Physical Exam:  Vital signs in last 24 hours: Temp:  [97.8 F (36.6 C)] 97.8 F (36.6 C) (09/04 2325) Pulse Rate:  [50-127] 50 (09/04 2325) Resp:  [16-18] 16 (09/04 2325) BP: (125-154)/(86-104) 154/97 (09/04 2325) SpO2:  [93 %-100 %] 98 % (09/04 2325) Weight:  [88.7 kg (195 lb 8 oz)-90.7 kg (200 lb)] 88.7 kg (195 lb 8 oz) (09/04 2325) Psych/General:  Alert and oriented, No acute distress HEENT: Normocephalic, atraumatic Lungs: Regular rate and effort Abdomen: Soft, nontender, nondistended, no abdominal masses Extremities: No edema Neurologic: Grossly intact  Laboratory Data:   Results for orders placed or performed during the hospital encounter of 02/19/16 (from the past 24 hour(s))  CBC with Differential/Platelet     Status: None   Collection Time: 02/19/16  4:35 PM  Result Value Ref Range   WBC 7.5 4.0 - 10.5 K/uL   RBC 5.52 4.22 - 5.81 MIL/uL   Hemoglobin 16.6 13.0 - 17.0 g/dL   HCT 48.6 39.0 - 52.0 %   MCV 88.0 78.0 - 100.0 fL   MCH 30.1 26.0 - 34.0 pg   MCHC 34.2 30.0 - 36.0 g/dL   RDW 12.2 11.5 - 15.5 %   Platelets 173 150 - 400 K/uL   Neutrophils Relative % 68 %   Neutro Abs 5.1 1.7 - 7.7 K/uL   Lymphocytes Relative 25 %   Lymphs Abs 1.9 0.7 - 4.0 K/uL   Monocytes Relative 6 %   Monocytes Absolute 0.5 0.1 - 1.0 K/uL   Eosinophils Relative 1 %   Eosinophils Absolute 0.1 0.0 - 0.7 K/uL   Basophils Relative 0 %   Basophils Absolute 0.0 0.0 - 0.1 K/uL  Basic metabolic panel     Status: None   Collection Time: 02/19/16  4:35 PM  Result Value Ref Range   Sodium 140 135 - 145 mmol/L   Potassium 3.8 3.5 - 5.1 mmol/L   Chloride 104 101 - 111 mmol/L   CO2 27 22 - 32 mmol/L   Glucose, Bld 88 65 - 99 mg/dL   BUN 14 6 -  20 mg/dL   Creatinine, Ser 1.19 0.61 - 1.24 mg/dL   Calcium 9.5 8.9 - 10.3 mg/dL   GFR calc non Af Amer >60 >60 mL/min   GFR calc Af Amer >60 >60 mL/min   Anion gap 9 5 - 15  Urinalysis, Routine w reflex microscopic (not at [facility])     Status: Abnormal   Collection Time: 02/19/16  4:35 PM  Result Value Ref Range   Color, Urine YELLOW YELLOW   APPearance CLEAR CLEAR   Specific Gravity, Urine <1.005 (L) 1.005 - 1.030   pH 5.5 5.0 - 8.0   Glucose, UA NEGATIVE NEGATIVE mg/dL   Hgb urine dipstick MODERATE (A) NEGATIVE   Bilirubin Urine NEGATIVE NEGATIVE   Ketones, ur NEGATIVE NEGATIVE mg/dL   Protein, ur NEGATIVE NEGATIVE mg/dL   Nitrite NEGATIVE NEGATIVE   Leukocytes, UA NEGATIVE NEGATIVE  Urine microscopic-add on     Status: Abnormal   Collection Time: 02/19/16  4:35 PM  Result Value Ref Range   Squamous Epithelial / LPF 0-5 (A)  NONE SEEN   WBC, UA 0-5 0 - 5 WBC/hpf   RBC / HPF 0-5 0 - 5 RBC/hpf   Bacteria, UA RARE (A) NONE SEEN   No results found for this or any previous visit (from the past 240 hour(s)). Creatinine:  Recent Labs  02/19/16 1635  CREATININE 1.19    Impression/Assessment/plan - I discussed again with the patient the nature risks benefits and alternatives to cystoscopy and ureteral stent placement. All questions answered. He elects to proceed. We discussed treatment of the stone in a few eeks with either shockwave lithotripsy or ureteroscopy and the nature, risks, benefits, and relative success of each of these procedures.   Nijah Orlich 02/20/2016, 2:59 AM

## 2016-02-20 NOTE — Discharge Summary (Signed)
Austin Hanson, is a 40 y.o. male  DOB Sep 29, 1975  MRN [id number].  Admission date:  02/19/2016  Admitting Physician  Roxan Hockey, MD  Discharge Date:  02/20/2016   Primary MD  No PCP Per Patient  Recommendations for primary care physician for things to follow:  - Please check CBC, BMP during next visit. - Follow with urology as an outpatient  Admission Diagnosis  Ureterolithiasis [N20.1] Flank pain [R10.9]   Discharge Diagnosis  Ureterolithiasis [N20.1] Flank pain [R10.9]    Principal Problem:   Nephrolithiasis Active Problems:   Kidney stone   Left nephrolithiasis      Past Medical History:  Diagnosis Date  . Cancer ([place])    skin cancer  . Renal disorder    kidney stones    Past Surgical History:  Procedure Laterality Date  . CYSTOSCOPY/RETROGRADE/URETEROSCOPY/STONE EXTRACTION WITH BASKET         History of present illness and  Hospital Course:     Kindly see H&P for history of present illness and admission details, please review complete Labs, Consult reports and Test reports for all details in brief  HPI  from the history and physical done on the day of admission 02/19/2016  Austin Hanson  is a 40 y.o. male, With history of hypertension on amlodipine and h/o Recurrent kidney stones since age 41 who presents with left flank pain associated with nausea but no vomiting. Left Flank pain started this morning, patient endorses some urinary frequency, No dysuria, no gross hematuria, No testicular symptoms, his current symptoms remind him of his previous kidney stone attacks.   no fever no chills. No diarrhea. Correctional officer at bedside at time of my evaluation.   In the ED CT abdomen with stone protocol reveals 0.81 cm left upper to mid ureteral stone with mild-to-moderate hydronephrosis.   Hospital Course  Lt Sided nephrolithiasis with mild to moderate    Hydronephrosis - Urology consult greatly appreciated, status post left proximal stent,   with plan for plan left ESWL and f/u AP clinic with Dr. Alyson Ingles for cysto/stent removal.  Hypertension - Continue with amlodipine  Discharge Condition:  Stable   Follow UP  Follow-up Information    Nicolette Bang, MD.   Specialty:  Urology Why:  Office will call with appointment Contact information: [address] [place] [address] [phone]             Discharge Instructions  and  Discharge Medications     Discharge Instructions    Diet - low sodium heart healthy    Complete by:  As directed   Discharge instructions    Complete by:  As directed   Follow with Primary MD  Get CBC, CMP,checked  by Primary MD next visit.    Activity: As tolerated with Full fall precautions use walker/cane & assistance as needed   Disposition Home   Diet: Regular diet   On your next visit with your primary care physician please Get Medicines reviewed and adjusted.   Please  request your Prim.MD to go over all Hospital Tests and Procedure/Radiological results at the follow up, please get all Hospital records sent to your Prim MD by signing hospital release before you go home.   If you experience worsening of your admission symptoms, develop shortness of breath, life threatening emergency, suicidal or homicidal thoughts you must seek medical attention immediately by calling 911 or calling your MD immediately  if symptoms less severe.  You Must read complete instructions/literature along with all the possible adverse reactions/side effects for all the Medicines you take and that have been prescribed to you. Take any new Medicines after you have completely understood and accpet all the possible adverse reactions/side effects.   Do not drive, operating heavy machinery, perform activities at heights, swimming or participation in water activities or provide baby sitting services if  your were admitted for syncope or siezures until you have seen by Primary MD or a Neurologist and advised to do so again.  Do not drive when taking Pain medications.    Do not take more than prescribed Pain, Sleep and Anxiety Medications  Special Instructions: If you have smoked or chewed Tobacco  in the last 2 yrs please stop smoking, stop any regular Alcohol  and or any Recreational drug use.  Wear Seat belts while driving.   Please note  You were cared for by a hospitalist during your hospital stay. If you have any questions about your discharge medications or the care you received while you were in the hospital after you are discharged, you can call the unit and asked to speak with the hospitalist on call if the hospitalist that took care of you is not available. Once you are die that you return to your primary care physician (or establish a relationship with a primary care physician if you do not have one) for your aftercare needs so that they can reassess your need for medications and monitor your lab values.       Medication List    TAKE these medications   amLODipine 5 MG tablet Commonly known as:  NORVASC Take 1 tablet (5 mg total) by mouth daily. What changed:  medication strength  how much to take   ibuprofen 200 MG tablet Commonly known as:  ADVIL Take 1 tablet (200 mg total) by mouth every 6 (six) hours as needed for fever, mild pain or moderate pain.   tamsulosin 0.4 MG Caps capsule Commonly known as:  FLOMAX Take 1 capsule (0.4 mg total) by mouth 2 (two) times daily.         Diet and Activity recommendation: See Discharge Instructions above   Consults obtained -  Urology Dr. Junious Silk   Major procedures and Radiology Reports - PLEASE review detailed and final reports for all details, in brief -   Left ureteral stent, cystoscopy, left retrograde pyelogram by Dr. Junious Silk on 9/5   Dg Abd 1 View  Result Date: 02/20/2016 CLINICAL DATA:  Left ureteral  stone EXAM: ABDOMEN - 1 VIEW COMPARISON:  02/19/2016 FINDINGS: There is normal small bowel gas pattern. Moderate stool noted in right colon. Left ureteral stent in place. Pelvic phleboliths are noted. Again noted a calcified calculus in proximal left ureter measures 8 mm just above the transverse process of L3 vertebral body. IMPRESSION: Left ureteral stent in place. Pelvic phleboliths are noted. Again noted a calcified calculus in proximal left ureter measures 8 mm just above the transverse process of L3 vertebral body. Electronically Signed   By: Julien Girt  Pop M.D.   On: 02/20/2016 09:32   Ct Renal Stone Study  Result Date: 02/19/2016 CLINICAL DATA:  Acute onset left flank pain at 8 a.m. this morning. History of kidney stones. EXAM: CT ABDOMEN AND PELVIS WITHOUT CONTRAST TECHNIQUE: Multidetector CT imaging of the abdomen and pelvis was performed following the standard protocol without IV contrast. COMPARISON:  None. FINDINGS: Mild dependent atelectasis is seen in the left lung base. Lung bases are otherwise clear. No pleural or pericardial effusion. Heart size is normal. There is mild left hydronephrosis with stranding about the left kidney and ureter due to a mid to upper left ureteral stone at the level of the L3 measuring 0.8 cm AP by 0.5 cm transverse by 1 cm craniocaudal. No other urinary tract stones are seen on the left. The patient has 3 small nonobstructing right renal stones. No hydronephrosis on the right or ureteral stone. No urinary bladder stones. Seminal vesicles and prostate gland are unremarkable. The gallbladder, liver, spleen, adrenal glands and pancreas appear normal. Small fat containing umbilical hernia is noted. The stomach and small and large bowel appear normal. No evidence of appendicitis is identified. No lymphadenopathy or fluid. No lytic or sclerotic bony lesion. IMPRESSION: Mild to moderate left hydronephrosis due to a 0.8 x 0.5 x 1.0 cm upper to mid left ureteral stone. Three small  nonobstructing stones right kidney. Small fat containing umbilical hernia. Electronically Signed   By: Inge Rise M.D.   On: 02/19/2016 17:39    Micro Results    Recent Results (from the past 240 hour(s))  MRSA PCR Screening     Status: None   Collection Time: 02/20/16 12:55 AM  Result Value Ref Range Status   MRSA by PCR NEGATIVE NEGATIVE Final    Comment:        The GeneXpert MRSA Assay (FDA approved for NASAL specimens only), is one component of a comprehensive MRSA colonization surveillance program. It is not intended to diagnose MRSA infection nor to guide or monitor treatment for MRSA infections.        Today   Subjective:   Austin Hanson today has no headache,no chest  pain,Abdominal pain significantly subsided  Objective:   Blood pressure 128/84, pulse 68, temperature 97.6 F (36.4 C), temperature source Axillary, resp. rate 16, height 5\' 10"  (1.778 m), weight 88.7 kg (195 lb 8 oz), SpO2 99 %.   Intake/Output Summary (Last 24 hours) at 02/20/16 1230 Last data filed at 02/20/16 0920  Gross per 24 hour  Intake             2615 ml  Output              850 ml  Net             1765 ml    Exam Awake Alert, Oriented x 3, Golden.AT,PERRAL Supple Neck,No JVD, No cervical lymphadenopathy appriciated.  Symmetrical Chest wall movement, Good air movement bilaterally, CTAB RRR,No Gallops,Rubs or new Murmurs, No Parasternal Heave +ve B.Sounds, Abd Soft, Non tender,No rebound -guarding or rigidity. No Cyanosis, Clubbing or edema, No new Rash or bruise  Data Review   CBC w Diff:  Lab Results  Component Value Date   WBC 5.8 02/20/2016   HGB 13.7 02/20/2016   HCT 40.6 02/20/2016   PLT 161 02/20/2016   LYMPHOPCT 25 02/19/2016   MONOPCT 6 02/19/2016   EOSPCT 1 02/19/2016   BASOPCT 0 02/19/2016    CMP:  Lab Results  Component Value Date  NA 139 02/20/2016   K 3.3 (L) 02/20/2016   CL 106 02/20/2016   CO2 28 02/20/2016   BUN 10 02/20/2016    CREATININE 1.01 02/20/2016  .   Total Time in preparing paper work, data evaluation and todays exam - 35 minutes  Tahir Blank M.D on 02/20/2016 at 12:30 PM  [facility]   Office  [phone]

## 2016-02-20 NOTE — Progress Notes (Signed)
Pharmacy Antibiotic Note  Austin Hanson is a 40 y.o. male admitted on 02/19/2016 with Left proximal ureteral stone s/p left ureteral stent, cystoscopy, left retrograde pyelogram on 9/5.  Pharmacy has been consulted for cefazoline dosing for surgical prophylaxis.   Plan: Cefazolin 1g IV q8h x 2 doses postop today. Pharmacy will sign off. Please re-consult if needed.  Height: 5\' 10"  (177.8 cm) Weight: 195 lb 8 oz (88.7 kg) IBW/kg (Calculated) : 73  Temp (24hrs), Avg:97.5 F (36.4 C), Min:97.2 F (36.2 C), Max:97.8 F (36.6 C)   Recent Labs Lab 02/19/16 1635 02/20/16 0535  WBC 7.5 5.8  CREATININE 1.19 1.01    Estimated Creatinine Clearance: 106.9 mL/min (by C-G formula based on SCr of 1.01 mg/dL).    Allergies  Allergen Reactions  . Morphine And Related Hives    Antimicrobials this admission: 9/5 Cefazolin periop  Microbiology results: 9/5 MRSA PCR: negative  Thank you for allowing pharmacy to be a part of this patient's care.  Austin Hanson 02/20/2016 8:49 AM

## 2016-02-20 NOTE — Progress Notes (Signed)
I called and spoke with patient. His pain is under control but he is requiring toradol and dilaudid. We discussed I discussed with the patient the nature, potential benefits, risks and alternatives to cystoscopy, left retrograde pyelogram, left ureteral stent, including side effects of the proposed treatment, the likelihood of the patient achieving the goals of the procedure, and any potential problems that might occur during the procedure or recuperation. Discussed rationale for staged procedure and option of continued stone passage. All questions answered. Patient elects to proceed.

## 2016-02-20 NOTE — OR Nursing (Signed)
[employer] accompanied the patient and sat with patient in Holding room.  Followed patient to OR and sat outside OR door.

## 2016-02-22 NOTE — Anesthesia Postprocedure Evaluation (Signed)
Anesthesia Post Note  Patient: Austin Hanson  Procedure(s) Performed: Procedure(s) (LRB): CYSTOSCOPY WITH RETROGRADE PYELOGRAM/URETERAL STENT PLACEMENT (Left)  Patient location during evaluation: PACU Anesthesia Type: General Level of consciousness: sedated Pain management: satisfactory to patient Vital Signs Assessment: post-procedure vital signs reviewed and stable Respiratory status: spontaneous breathing Cardiovascular status: stable Anesthetic complications: no    Last Vitals:  Vitals:   02/20/16 0430 02/20/16 0442  BP: (!) 139/96 128/84  Pulse: 62 68  Resp:  16  Temp: 36.2 C 36.4 C    Last Pain:  Vitals:   02/20/16 1452  TempSrc:   PainSc: [place]

## 2016-02-26 ENCOUNTER — Other Ambulatory Visit: Payer: Self-pay | Admitting: Urology

## 2016-03-01 ENCOUNTER — Encounter (HOSPITAL_COMMUNITY): Payer: Self-pay | Admitting: *Deleted

## 2016-03-01 NOTE — Progress Notes (Signed)
History and med list. Obtained by medical provider at [facility]. Mrs. Howlett LPN. All ESWL instructions reviewed with her as well. She verbalized understanding.

## 2016-03-04 ENCOUNTER — Ambulatory Visit (HOSPITAL_COMMUNITY)

## 2016-03-04 ENCOUNTER — Ambulatory Visit (HOSPITAL_COMMUNITY)
Admission: RE | Admit: 2016-03-04 | Discharge: 2016-03-04 | Disposition: A | Discharge status: Home / Self Care | Source: Ambulatory Visit | Attending: Urology | Admitting: Urology

## 2016-03-04 ENCOUNTER — Encounter (HOSPITAL_COMMUNITY)
Admission: RE | Disposition: A | Discharge status: Home / Self Care | Payer: Self-pay | Source: Ambulatory Visit | Attending: Urology

## 2016-03-04 ENCOUNTER — Encounter (HOSPITAL_COMMUNITY): Payer: Self-pay | Admitting: General Practice

## 2016-03-04 DIAGNOSIS — N201 Calculus of ureter: Secondary | ICD-10-CM | POA: Diagnosis present

## 2016-03-04 DIAGNOSIS — I10 Essential (primary) hypertension: Secondary | ICD-10-CM | POA: Diagnosis not present

## 2016-03-04 HISTORY — DX: Essential (primary) hypertension: I10

## 2016-03-04 HISTORY — DX: Bronchitis, not specified as acute or chronic: J40

## 2016-03-04 SURGERY — LITHOTRIPSY, ESWL
Anesthesia: LOCAL | Laterality: Left

## 2016-03-04 MED ORDER — SODIUM CHLORIDE 0.9 % IV SOLN
250.0000 mL | INTRAVENOUS | Status: DC | PRN
Start: 2016-03-04 — End: 2016-03-04

## 2016-03-04 MED ORDER — SODIUM CHLORIDE 0.9% FLUSH: 3.0000 mL | INTRAVENOUS | Status: DC | PRN

## 2016-03-04 MED ORDER — DIAZEPAM 5 MG PO TABS
10.0000 mg | ORAL_TABLET | ORAL | Status: AC
  Administered 2016-03-04: 10 mg via ORAL
  Filled 2016-03-04: qty 2

## 2016-03-04 MED ORDER — CIPROFLOXACIN HCL 500 MG PO TABS
500.0000 mg | ORAL_TABLET | ORAL | Status: AC
  Administered 2016-03-04: 500 mg via ORAL
  Filled 2016-03-04: qty 1

## 2016-03-04 MED ORDER — ACETAMINOPHEN 650 MG RE SUPP
650.0000 mg | RECTAL | Status: DC | PRN
  Filled 2016-03-04: qty 1

## 2016-03-04 MED ORDER — DIPHENHYDRAMINE HCL 25 MG PO CAPS
25.0000 mg | ORAL_CAPSULE | ORAL | Status: AC
  Administered 2016-03-04: 25 mg via ORAL
  Filled 2016-03-04: qty 1

## 2016-03-04 MED ORDER — ACETAMINOPHEN 325 MG PO TABS: 650.0000 mg | ORAL_TABLET | ORAL | Status: DC | PRN

## 2016-03-04 MED ORDER — SODIUM CHLORIDE 0.9% FLUSH: 3.0000 mL | Freq: Two times a day (BID) | INTRAVENOUS | Status: DC

## 2016-03-04 MED ORDER — OXYCODONE HCL 5 MG PO TABS: 5.0000 mg | ORAL_TABLET | ORAL | Status: DC | PRN

## 2016-03-04 MED ORDER — SODIUM CHLORIDE 0.9 % IV SOLN
INTRAVENOUS | Status: DC
  Administered 2016-03-04: 10:00:00 via INTRAVENOUS

## 2016-03-04 NOTE — H&P (Signed)
Urology History and Physical Exam  CC: Left kidney stone   HPI: 40 year old male presenting for ESL of an 8 by 10 mm left UPJ stone. HU 100, SSD 11 cm.  PMH: Past Medical History:  Diagnosis Date  . Bronchitis 06/2015  . Cancer ([place])    skin cancer  . Hypertension   . Renal disorder    kidney stones    PSH: Past Surgical History:  Procedure Laterality Date  . CYSTOSCOPY W/ URETERAL STENT PLACEMENT Left 02/20/2016   Procedure: CYSTOSCOPY WITH RETROGRADE PYELOGRAM/URETERAL STENT PLACEMENT;  Surgeon: Festus Aloe, MD;  Location: [facility];  Service: Urology;  Laterality: Left;  . CYSTOSCOPY/RETROGRADE/URETEROSCOPY/STONE EXTRACTION WITH BASKET    . TONSILLECTOMY     as a child    Allergies: Allergies  Allergen Reactions  . Morphine And Related Hives    Medications: Prescriptions Prior to Admission  Medication Sig Dispense Refill Last Dose  . amLODipine (NORVASC) 5 MG tablet Take 1 tablet (5 mg total) by mouth daily. 30 tablet 0 03/03/2016 at 0900  . tamsulosin (FLOMAX) 0.4 MG CAPS capsule Take 1 capsule (0.4 mg total) by mouth 2 (two) times daily. 30 capsule 0 03/03/2016 at 0900  . traMADol (ULTRAM) 50 MG tablet Take 50 mg by mouth 3 (three) times daily.   03/03/2016 at 2200  . ibuprofen (ADVIL) 200 MG tablet Take 1 tablet (200 mg total) by mouth every 6 (six) hours as needed for fever, mild pain or moderate pain. 30 tablet 0 02/28/2016     Social History: Social History   Social History  . Marital status: Unknown    Spouse name: N/A  . Number of children: N/A  . Years of education: N/A   Occupational History  . Not on file.   Social History Main Topics  . Smoking status: Never Smoker  . Smokeless tobacco: Never Used  . Alcohol use No  . Drug use: No  . Sexual activity: Not on file   Other Topics Concern  . Not on file   Social History Narrative  . No narrative on file    Family History: Family History  Problem Relation Age of Onset  . Hypertension  Mother   . Hypertension Father     Review of Systems: Positive: Left flank pain Negative:  A further 10 point review of systems was negative except what is listed in the HPI.                  Physical Exam: @VITALS2 @ General: No acute distress.  Awake. Head:  Normocephalic.  Atraumatic. ENT:  EOMI.  Mucous membranes moist Neck:  Supple.  No lymphadenopathy. CV:  S1 present. S2 present. Regular rate. Pulmonary: Equal effort bilaterally.  Clear to auscultation bilaterally. Abdomen: Soft.  Non tender to palpation. Skin:  Normal turgor.  No visible rash. Extremity: No gross deformity of bilateral upper extremities.  No gross deformity of                             lower extremities. Neurologic: Alert. Appropriate mood.    Studies:  No results for input(s): HGB, WBC, PLT in the last 72 hours.  No results for input(s): NA, K, CL, CO2, BUN, CREATININE, CALCIUM, GFRNONAA, GFRAA in the last 72 hours.  Invalid input(s): MAGNESIUM   No results for input(s): INR, APTT in the last 72 hours.  Invalid input(s): PT   Invalid input(s): ABG  Assessment:  8x10 mm left UPJ stone  Plan: Left ESL

## 2016-03-04 NOTE — Discharge Instructions (Signed)
See [facility] discharge instructions in chart.  

## 2016-03-04 NOTE — Op Note (Signed)
See [facility] note scanned into chart. 

## 2016-03-20 ENCOUNTER — Ambulatory Visit (HOSPITAL_COMMUNITY)
Admission: RE | Admit: 2016-03-20 | Discharge: 2016-03-20 | Disposition: A | Discharge status: Home / Self Care | Source: Ambulatory Visit | Attending: Urology | Admitting: Urology

## 2016-03-20 ENCOUNTER — Ambulatory Visit (INDEPENDENT_AMBULATORY_CARE_PROVIDER_SITE_OTHER): Admitting: Urology

## 2016-03-20 ENCOUNTER — Other Ambulatory Visit: Payer: Self-pay | Admitting: Urology

## 2016-03-20 DIAGNOSIS — Z9889 Other specified postprocedural states: Secondary | ICD-10-CM | POA: Diagnosis not present

## 2016-03-20 DIAGNOSIS — N201 Calculus of ureter: Secondary | ICD-10-CM | POA: Diagnosis present

## 2017-10-22 IMAGING — CT CT RENAL STONE PROTOCOL
1 series · 15 of 32 positions shown, 19 images · non-contrast
Comparison: None.

CLINICAL DATA: Acute onset left flank pain at 8 a.m. this morning.
History of kidney stones.

EXAM:
CT ABDOMEN AND PELVIS WITHOUT CONTRAST
TECHNIQUE: Multidetector CT imaging of the abdomen and pelvis was performed
following the standard protocol without IV contrast.

[Series 2: routine abd pel with · axial · 0.72mm/px · z∈[+582,+1038]mm · 15 of 102 slices shown, 19 images]
[im 7/102  soft-tissue]
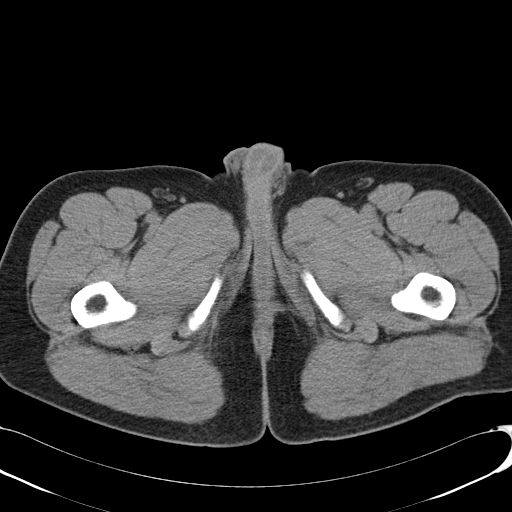
[im 7/102  bone]
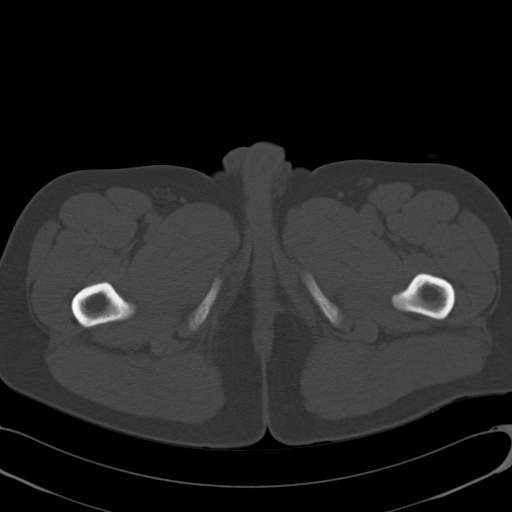
[im 14/102  soft-tissue]
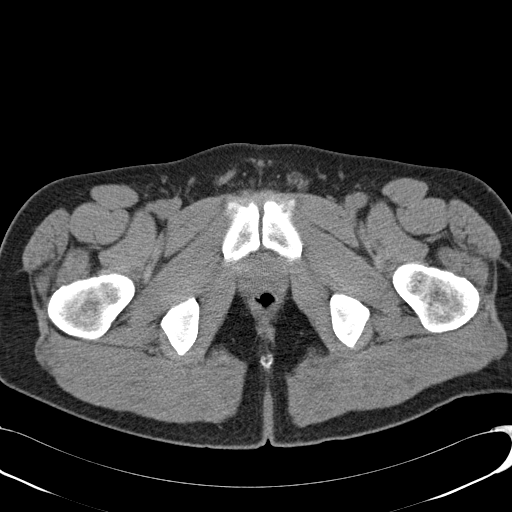
[im 20/102  soft-tissue]
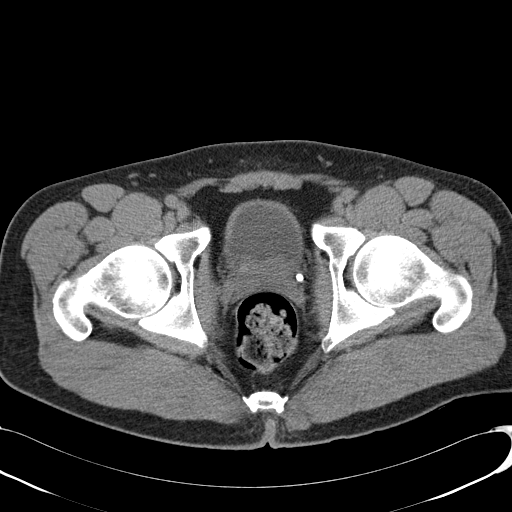
[im 30/102  soft-tissue]
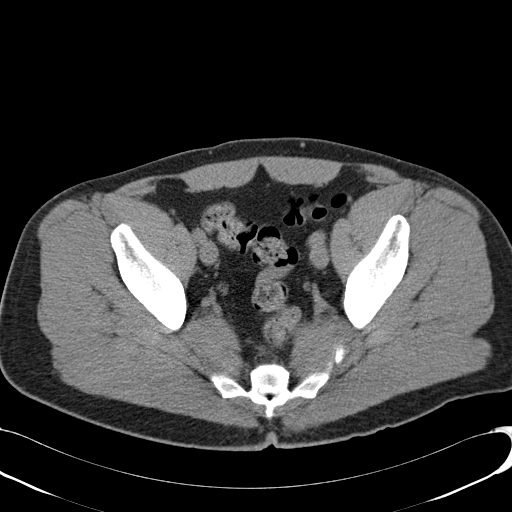
[im 36/102  soft-tissue]
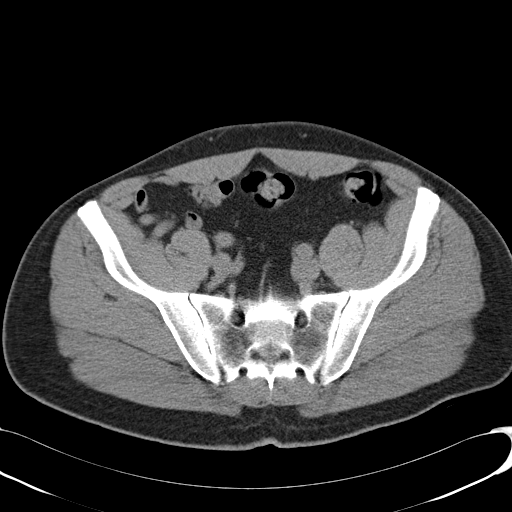
[im 43/102  soft-tissue]
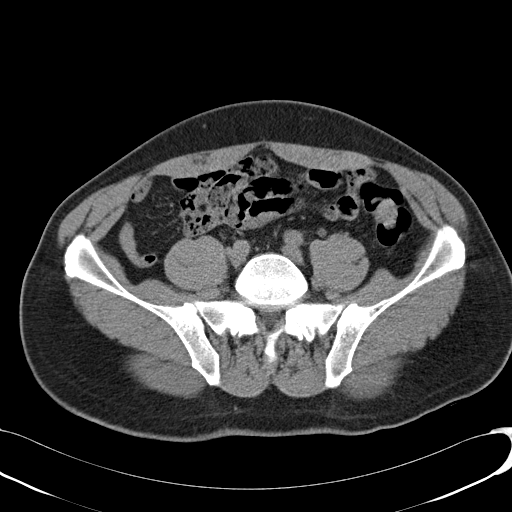
[im 53/102  soft-tissue]
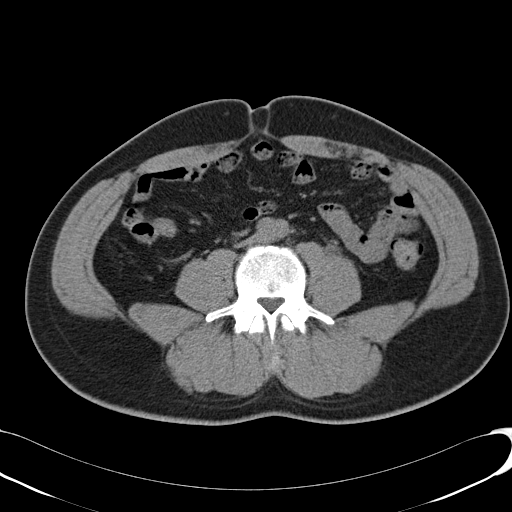
[im 59/102  soft-tissue]
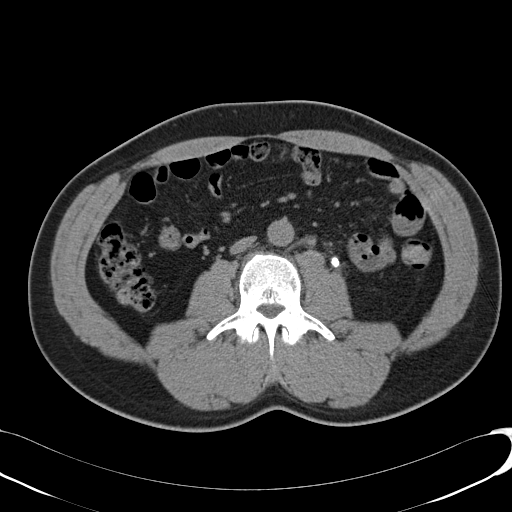
[im 66/102  soft-tissue]
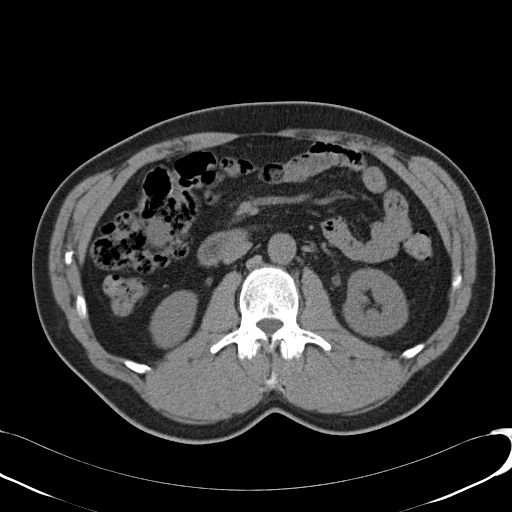
[im 66/102  bone]
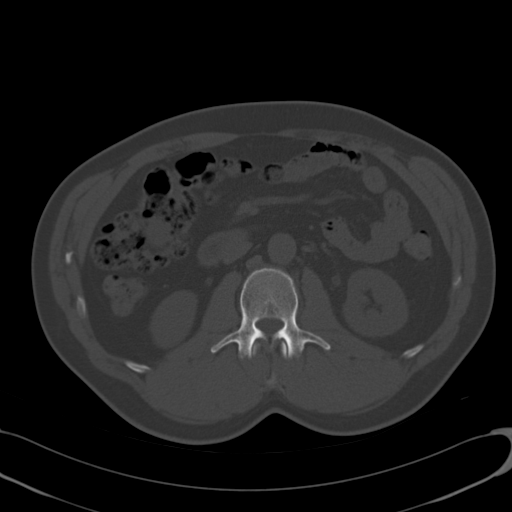
[im 72/102  soft-tissue]
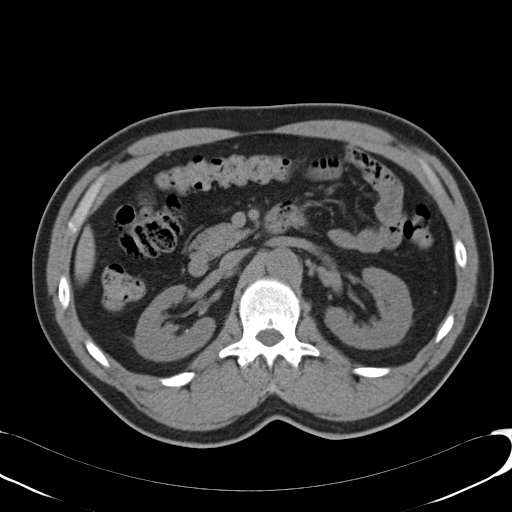
[im 82/102  soft-tissue]
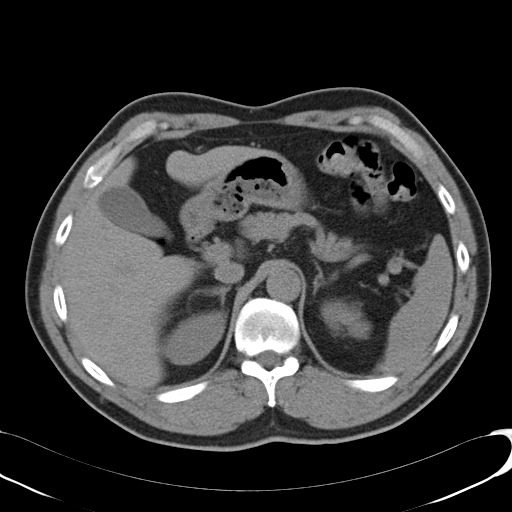
[im 88/102  soft-tissue]
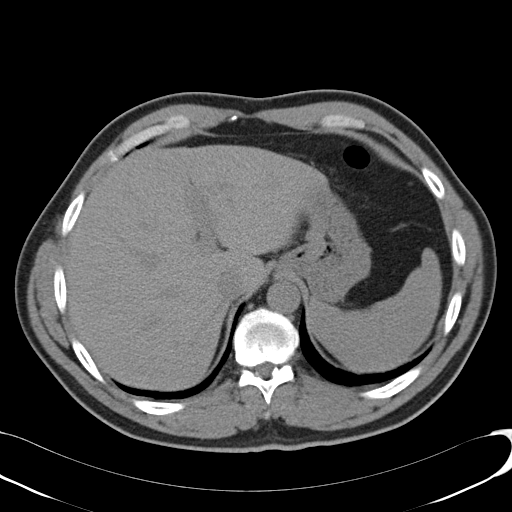
[im 88/102  lung]
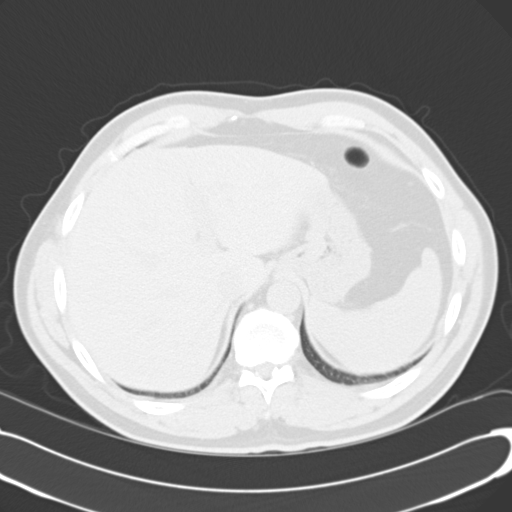
[im 92/102  lung]
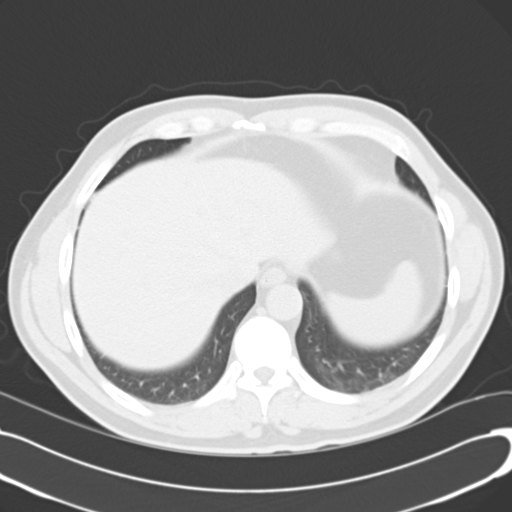
[im 95/102  soft-tissue]
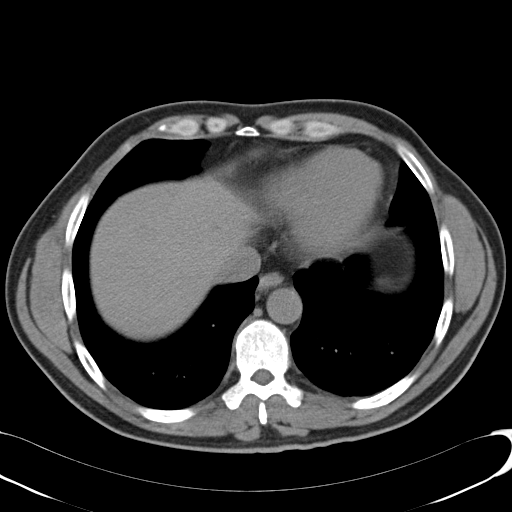
[im 95/102  lung]
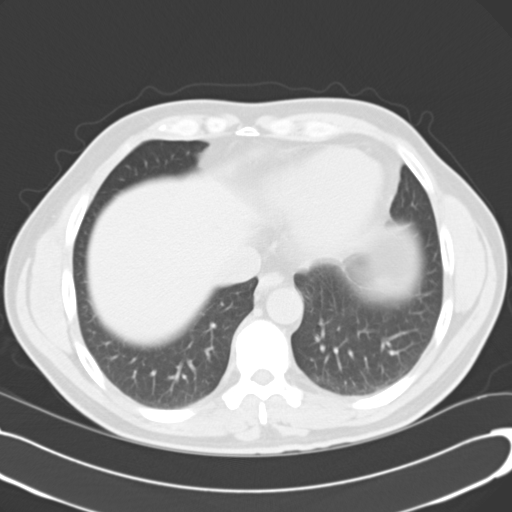
[im 98/102  lung]
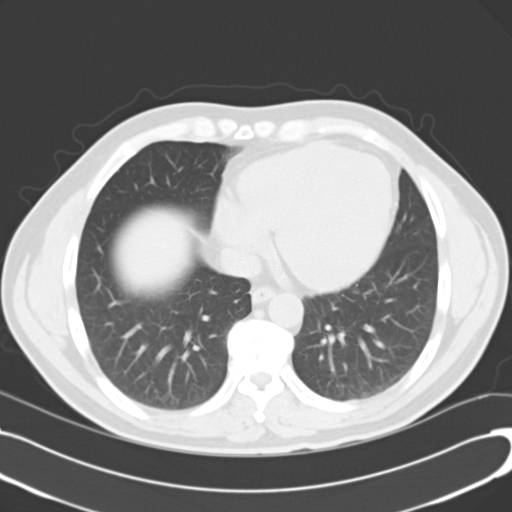

[15 of 32 positions shown; findings below may reference images not displayed]

FINDINGS: Mild dependent atelectasis is seen in the left lung base. Lung bases
are otherwise clear. No pleural or pericardial effusion. Heart size
is normal.

There is mild left hydronephrosis with stranding about the left
kidney and ureter due to a mid to upper left ureteral stone at the
level of the L3 measuring 0.8 cm AP by 0.5 cm transverse by 1 cm
craniocaudal. No other urinary tract stones are seen on the left.
The patient has 3 small nonobstructing right renal stones. No
hydronephrosis on the right or ureteral stone. No urinary bladder
stones. Seminal vesicles and prostate gland are unremarkable.

The gallbladder, liver, spleen, adrenal glands and pancreas appear
normal. Small fat containing umbilical hernia is noted. The stomach
and small and large bowel appear normal. No evidence of appendicitis
is identified. No lymphadenopathy or fluid.

No lytic or sclerotic bony lesion.
IMPRESSION: Mild to moderate left hydronephrosis due to a 0.8 x 0.5 x 1.0 cm
upper to mid left ureteral stone.

Three small nonobstructing stones right kidney.

Small fat containing umbilical hernia.
